# Patient Record
Sex: Male | Born: 1935 | Race: White | Hispanic: No | Marital: Married
Health system: Southern US, Community
[De-identification: ages and names within clinical notes are randomized; demographics above are authoritative.]

---

## 2004-03-26 ENCOUNTER — Other Ambulatory Visit: Payer: Self-pay

## 2005-05-09 ENCOUNTER — Ambulatory Visit: Payer: Self-pay | Admitting: Urology

## 2005-05-09 ENCOUNTER — Other Ambulatory Visit: Payer: Self-pay

## 2005-05-17 ENCOUNTER — Ambulatory Visit: Payer: Self-pay | Admitting: Urology

## 2005-10-25 ENCOUNTER — Ambulatory Visit: Payer: Self-pay | Admitting: Urology

## 2005-10-30 ENCOUNTER — Ambulatory Visit: Payer: Self-pay | Admitting: Urology

## 2005-10-30 ENCOUNTER — Other Ambulatory Visit: Payer: Self-pay

## 2005-11-06 ENCOUNTER — Ambulatory Visit: Payer: Self-pay | Admitting: Urology

## 2006-11-01 ENCOUNTER — Ambulatory Visit: Payer: Self-pay | Admitting: Urology

## 2007-05-14 ENCOUNTER — Ambulatory Visit: Payer: Self-pay | Admitting: Urology

## 2007-09-25 ENCOUNTER — Emergency Department: Payer: Self-pay | Admitting: Emergency Medicine

## 2008-01-07 ENCOUNTER — Ambulatory Visit: Payer: Self-pay | Admitting: Urology

## 2008-04-22 ENCOUNTER — Ambulatory Visit: Payer: Self-pay | Admitting: Endocrinology

## 2008-04-29 ENCOUNTER — Emergency Department: Payer: Self-pay | Admitting: Emergency Medicine

## 2008-06-08 ENCOUNTER — Other Ambulatory Visit: Payer: Self-pay

## 2008-06-08 ENCOUNTER — Ambulatory Visit: Payer: Self-pay | Admitting: Vascular Surgery

## 2008-06-14 ENCOUNTER — Inpatient Hospital Stay: Payer: Self-pay | Admitting: Vascular Surgery

## 2008-12-29 ENCOUNTER — Ambulatory Visit: Payer: Self-pay | Admitting: Urology

## 2009-11-09 ENCOUNTER — Ambulatory Visit: Payer: Self-pay | Admitting: Endocrinology

## 2010-01-31 ENCOUNTER — Emergency Department: Payer: Self-pay | Admitting: Emergency Medicine

## 2010-02-03 ENCOUNTER — Ambulatory Visit: Payer: Self-pay | Admitting: Endocrinology

## 2010-03-28 ENCOUNTER — Ambulatory Visit: Payer: Self-pay | Admitting: Psychology

## 2010-09-24 ENCOUNTER — Inpatient Hospital Stay: Payer: Self-pay | Admitting: Endocrinology

## 2011-07-14 ENCOUNTER — Emergency Department: Payer: Self-pay | Admitting: Emergency Medicine

## 2011-08-08 ENCOUNTER — Emergency Department: Payer: Self-pay | Admitting: Emergency Medicine

## 2011-08-17 ENCOUNTER — Observation Stay: Payer: Self-pay | Admitting: Student

## 2011-08-17 ENCOUNTER — Ambulatory Visit: Payer: Self-pay | Admitting: Urology

## 2011-09-18 ENCOUNTER — Ambulatory Visit: Payer: Self-pay | Admitting: Urology

## 2011-09-18 LAB — CBC WITH DIFFERENTIAL/PLATELET
Eosinophil %: 1.8 %
Lymphocyte #: 2.1 10*3/uL (ref 1.0–3.6)
Lymphocyte %: 28.6 %
MCH: 31.6 pg (ref 26.0–34.0)
MCV: 96 fL (ref 80–100)
Monocyte #: 0.7 10*3/uL (ref 0.0–0.7)
Monocyte %: 10.2 %
Neutrophil %: 58.9 %
Platelet: 225 10*3/uL (ref 150–440)
RDW: 15.1 % — ABNORMAL HIGH (ref 11.5–14.5)
WBC: 7.3 10*3/uL (ref 3.8–10.6)

## 2011-09-24 ENCOUNTER — Ambulatory Visit: Payer: Self-pay | Admitting: Cardiology

## 2011-09-25 ENCOUNTER — Ambulatory Visit: Payer: Self-pay | Admitting: Urology

## 2011-11-07 ENCOUNTER — Ambulatory Visit: Payer: Self-pay | Admitting: Unknown Physician Specialty

## 2011-12-10 ENCOUNTER — Ambulatory Visit: Payer: Self-pay | Admitting: Internal Medicine

## 2011-12-23 LAB — CBC
HCT: 34.6 % — ABNORMAL LOW (ref 40.0–52.0)
HGB: 11.4 g/dL — ABNORMAL LOW (ref 13.0–18.0)
MCH: 31.1 pg (ref 26.0–34.0)
MCHC: 33.1 g/dL (ref 32.0–36.0)
MCV: 94 fL (ref 80–100)
Platelet: 221 10*3/uL (ref 150–440)
RBC: 3.67 10*6/uL — ABNORMAL LOW (ref 4.40–5.90)

## 2011-12-23 LAB — COMPREHENSIVE METABOLIC PANEL
Anion Gap: 10 (ref 7–16)
Bilirubin,Total: 0.2 mg/dL (ref 0.2–1.0)
Calcium, Total: 8.8 mg/dL (ref 8.5–10.1)
Chloride: 109 mmol/L — ABNORMAL HIGH (ref 98–107)
Creatinine: 1.02 mg/dL (ref 0.60–1.30)
EGFR (African American): >60
EGFR (Non-African Amer.): >60
Potassium: 4.1 mmol/L (ref 3.5–5.1)
SGOT(AST): 19 U/L (ref 15–37)

## 2011-12-23 LAB — URINALYSIS, COMPLETE
Bilirubin,UR: NEGATIVE
Glucose,UR: NEGATIVE mg/dL (ref 0–75)
Ketone: NEGATIVE
Ph: 5 (ref 4.5–8.0)
Specific Gravity: 1.017 (ref 1.003–1.030)
Squamous Epithelial: NONE SEEN
WBC UR: 2 /HPF (ref 0–5)

## 2011-12-23 LAB — CK TOTAL AND CKMB (NOT AT ARMC): CK, Total: 37 U/L (ref 35–232)

## 2011-12-23 LAB — APTT: Activated PTT: 28.9 secs (ref 23.6–35.9)

## 2011-12-23 LAB — PROTIME-INR: Prothrombin Time: 14 secs (ref 11.5–14.7)

## 2011-12-24 ENCOUNTER — Inpatient Hospital Stay: Payer: Self-pay | Admitting: Internal Medicine

## 2011-12-24 LAB — TSH: Thyroid Stimulating Horm: 0.165 u[IU]/mL — ABNORMAL LOW

## 2011-12-24 LAB — T4, FREE: Free Thyroxine: 1.31 ng/dL (ref 0.76–1.46)

## 2011-12-30 LAB — CULTURE, BLOOD (SINGLE)

## 2012-01-09 ENCOUNTER — Ambulatory Visit: Payer: Self-pay | Admitting: Internal Medicine

## 2012-01-09 DEATH — deceased

## 2012-05-14 IMAGING — CT CT HEAD WITHOUT CONTRAST
2 of 3 series · 15 of 40 positions shown, 18 images · non-contrast
Comparison: none

REASON FOR EXAM: unresponsive
COMMENTS:

PROCEDURE:     CT  - CT HEAD WITHOUT CONTRAST  - December 23, 2011  [DATE]
RESULT:     Comparison is made to a prior study dated 07/14/2011.
TECHNIQUE: Helical noncontrasted 5 mm sections were obtained from the skull
base to the vertex.

[Series 2: without · axial · non-contrast · 0.41mm/px · z∈[+266,+416]mm · 12 of 36 slices shown, 15 images]
[im 3/36  brain]
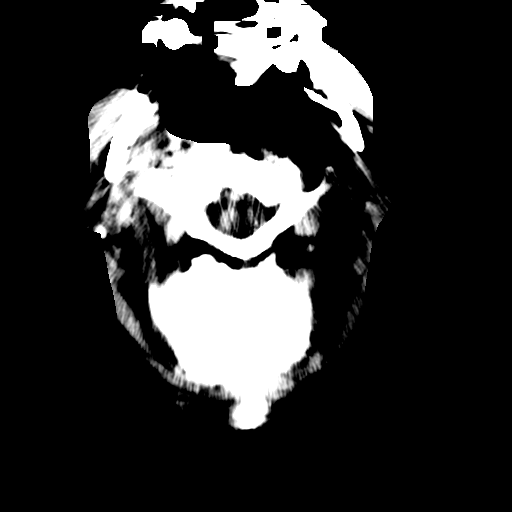
[im 3/36  bone]
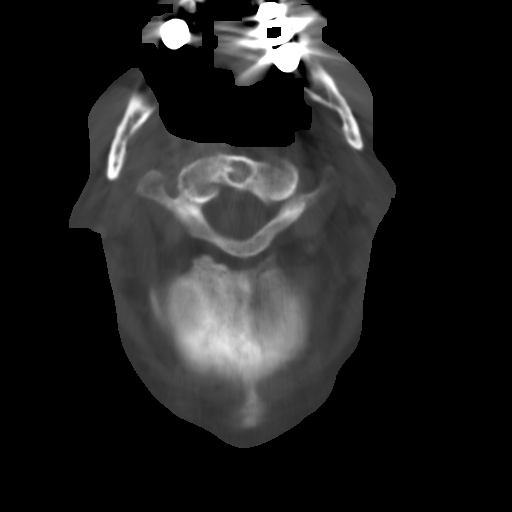
[im 6/36  brain]
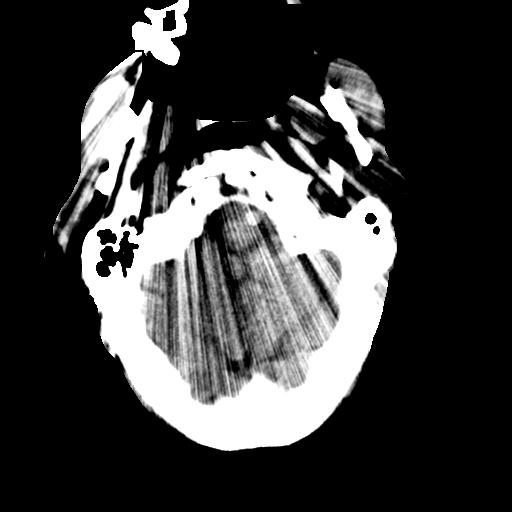
[im 8/36  brain]
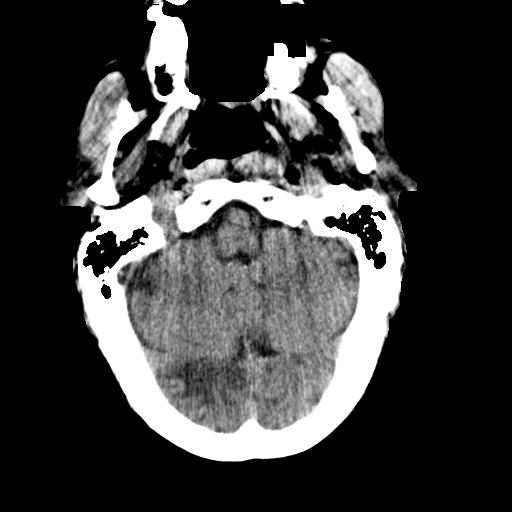
[im 11/36  brain]
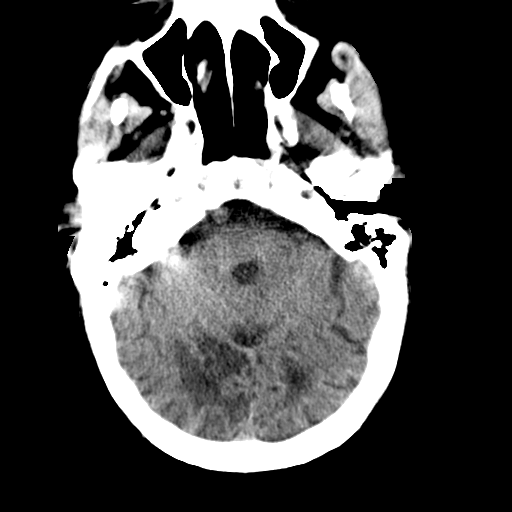
[im 13/36  brain]
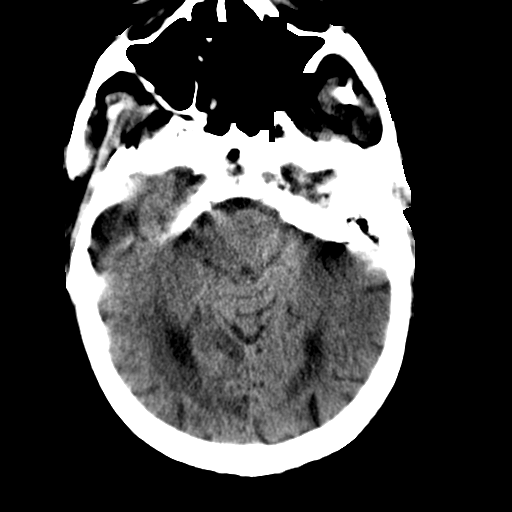
[im 13/36  bone]
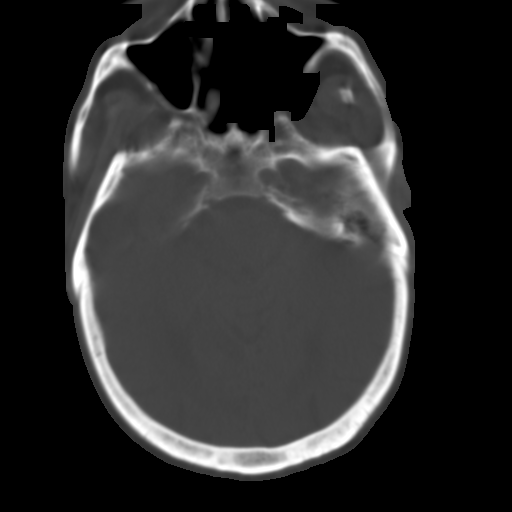
[im 16/36  brain]
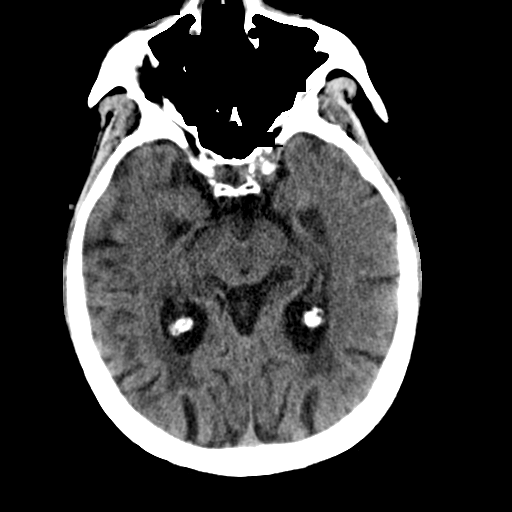
[im 21/36  brain]
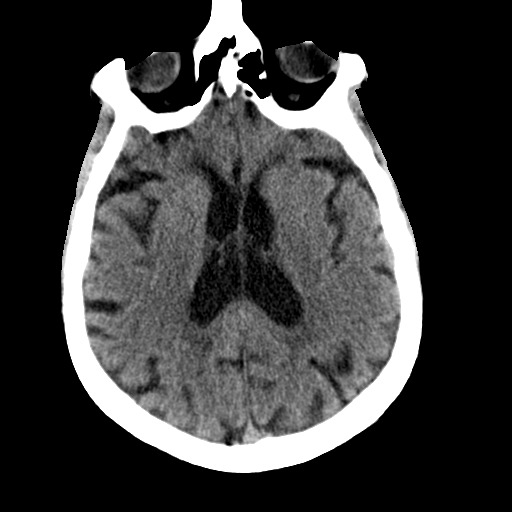
[im 23/36  brain]
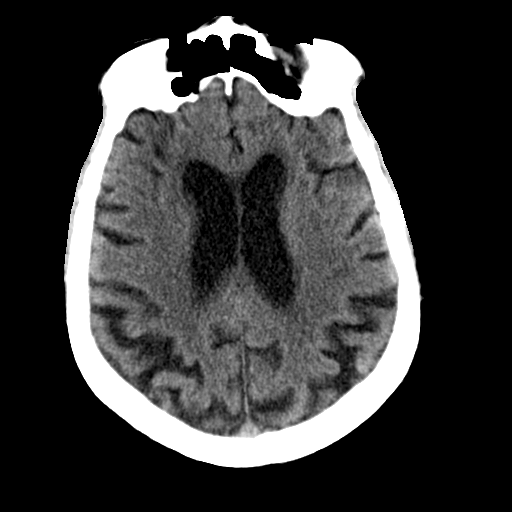
[im 26/36  brain]
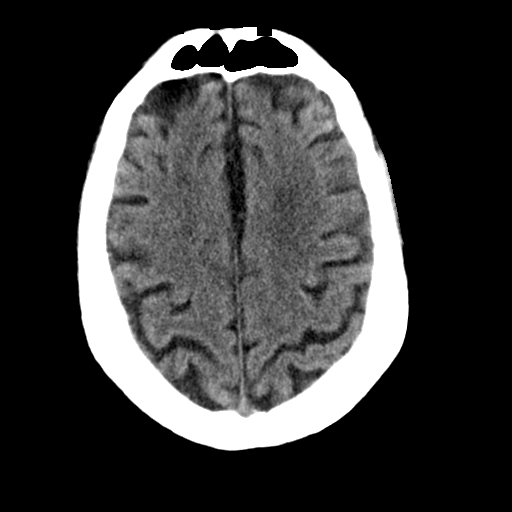
[im 26/36  bone]
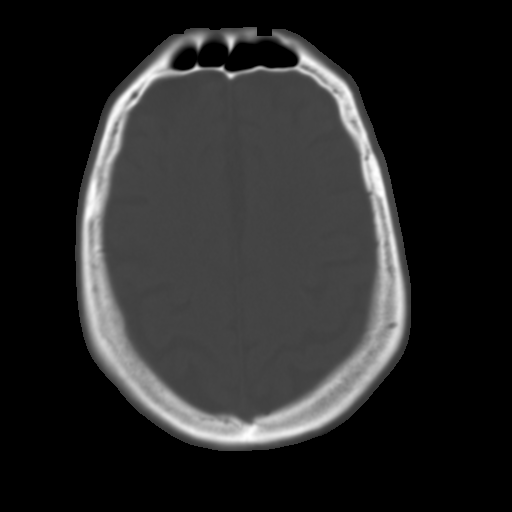
[im 28/36  brain]
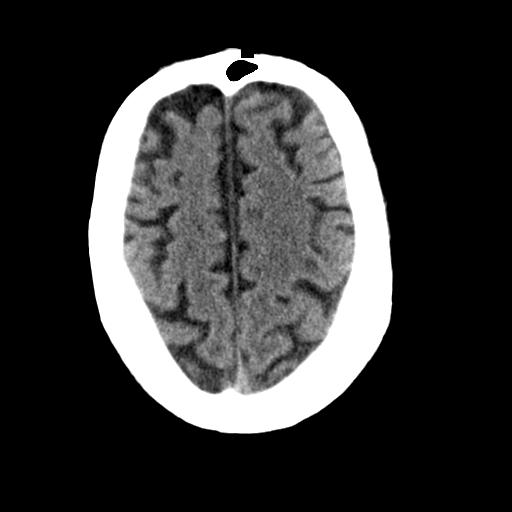
[im 31/36  brain]
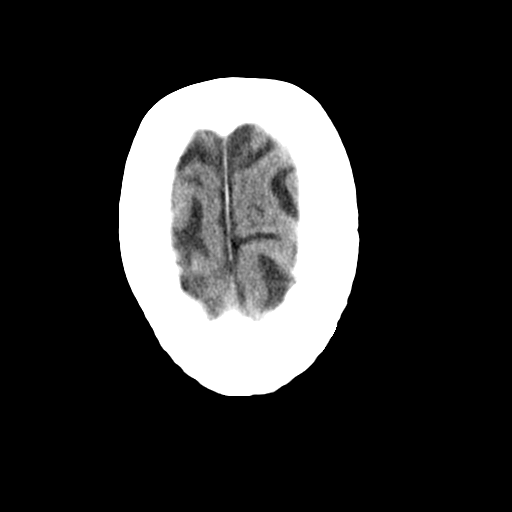
[im 33/36  brain]
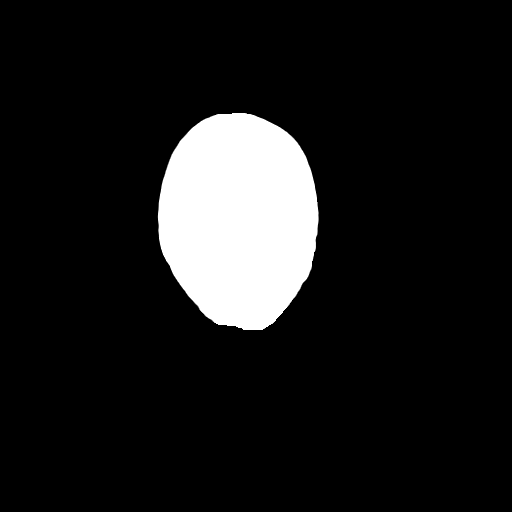

[Series 5: bone 2 · coronal · 0.41mm/px · 3 of 49 slices shown]
[im 20/49  brain]
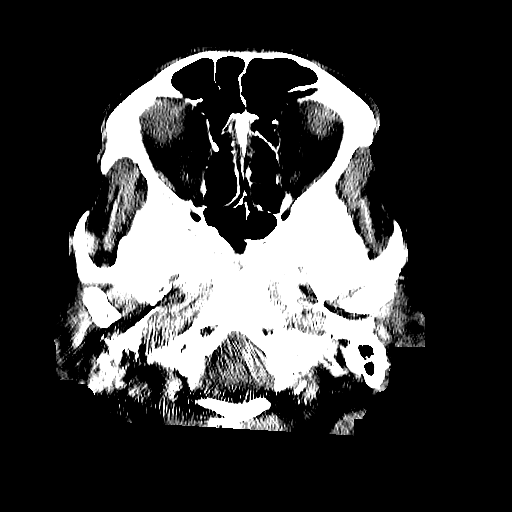
[im 23/49  brain]
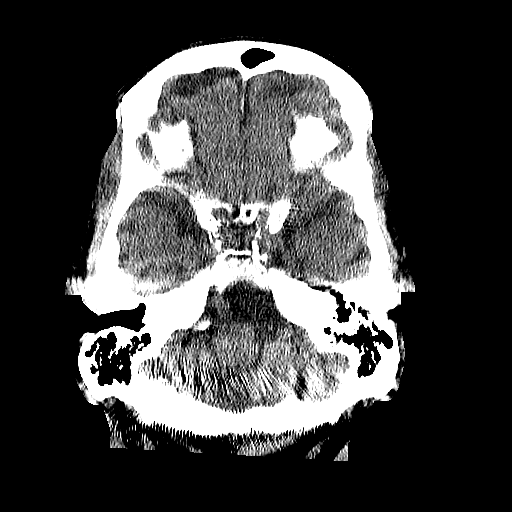
[im 26/49  brain]
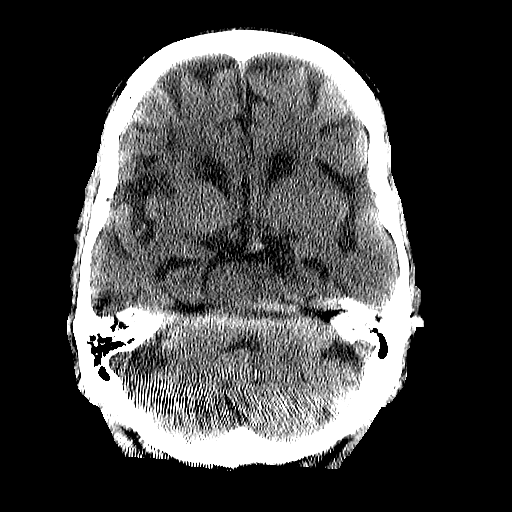

[15 of 40 positions shown; findings below may reference images not displayed]

FINDINGS: There is diffuse cortical atrophy as well as diffuse areas of
low-attenuation within the subcortical, deep, and periventricular white
matter regions. A focal area of low-attenuation projects within the
paramedial aspect of the right occipital lobe. This has an appearance of
encephalomalacic change. There is no evidence of subfalcine nor tonsillar
herniation. Mild hydrocephalus ex vacuo is identified. There is no evidence
of a depressed skull fracture. The paranasal sinuses and mastoid air cells
are patent.
IMPRESSION: 1. Involutional and chronic changes without evidence of acute abnormalities.
2. Dr. Mcnally of the Emergency Department was informed of these findings
via a preliminary faxed report.

## 2015-01-02 NOTE — Consult Note (Signed)
PATIENT NAME:  Cole Franklin, [name] MR#:  [id number] DATE OF BIRTH:  March 21, 1921  DATE OF CONSULTATION:  12/24/2011  REFERRING PHYSICIAN:   CONSULTING PHYSICIAN:  Marcina MillardAlexander Albertia Carvin, MD  PRIMARY CARE PHYSICIAN: Horton ChinShamil Morayati, MD  CARDIOLOGIST: Harold HedgeKenneth Fath, MD  CHIEF COMPLAINT: Unresponsive.   HISTORY OF PRESENT ILLNESS: The patient is an 81 year old gentleman referred for evaluation of bradycardia. The patient has baseline dementia and was in his usual state of health until yesterday. His home health provider found the patient on his knees and became unresponsive. EMS was called. The patient was unresponsive, but was found to be bradycardic. [facility] where he has remained bradycardic with heart rates in the 40s with normal blood pressure. The patient has remained unresponsive to sternal rub. Head CT was performed which revealed no acute changes.   PAST MEDICAL HISTORY:  1. Coronary artery disease status post myocardial infarction. 2. Ischemic cardiomyopathy with LV of 25 to 30%.  3. Status post left carotid endarterectomy in 2009.  4. History of respiratory failure felt to be due to congestive heart failure and left lower pneumonia, January 2012.  5. Dementia.  6. Hypertension.  7. Hyperlipidemia.  8. Atrial fibrillation.  9. Chronic obstructive pulmonary disease.  10. Gastroesophageal reflux disease. 11. History of nephrolithiasis.   MEDICATIONS:  1. Lisinopril 5 mg daily.  2. Potassium 40 mEq daily.  3. Lasix 10 mg daily.  4. Carvedilol 6.25 mg twice a day. 5. Imdur 30 mg daily.  6. Pravastatin 40 mg daily.  7. Aspirin 81 mg daily.  8. Namenda 10 mg twice a day. 9. Carafate twice a day with meals.  10. Synthroid 112 mcg daily.  11. Prilosec 20 mg every a.m.  12. Symbicort 1 puff twice a day. 13. Atrovent nasal spray 2 puffs twice a day. 14. Citracal 2 tabs daily. 15. Aricept 10 mg daily.  16. Clonazepam 0.5 mg daily.  17. Mirtazapine 7.5 mg  daily. 18. Vitamin D 1000 units daily.  19. Multi-Vite 1 daily.  20. Protonix 40 mg daily.   SOCIAL HISTORY: The patient lives at home with his wife with a 24/7 caregiver.   FAMILY HISTORY: No immediate family history of coronary artery disease or myocardial infarction.   REVIEW OF SYSTEMS: Given by the patient's daughter. CONSTITUTIONAL: No fever or chills. EYES: No blurry vision. EARS: No hearing loss. RESPIRATORY: No shortness of breath. CARDIOVASCULAR: No chest pain. GASTROINTESTINAL: No nausea, vomiting, or diarrhea. GU: No dysuria or hematuria. ENDOCRINE: No polyuria or polydipsia. NEUROLOGICAL: No focal muscle weakness or numbness. PSYCHOLOGICAL: No depression or anxiety. The patient does have baseline dementia.   PHYSICAL EXAMINATION:   VITAL SIGNS: Blood pressure 127/66, pulse 42, respirations 23, temperature 97.8, and pulse oximetry 99%.   HEENT: The patient is unresponsive.   NECK: Supple without thyromegaly.   LUNGS: Clear.   HEART: Normal jugular venous pressure. Normal point of maximal impulse. Bradycardia. Normal S1 and S2. No appreciable gallop, murmur, or rub.   ABDOMEN: Soft and nontender. Pulses were intact bilaterally. There was no cyanosis, clubbing, or edema.   MUSCULOSKELETAL: Normal muscle tone.   NEUROLOGIC: The patient is unresponsive to sternal rub.   IMPRESSION: An 81 year old gentleman who presents with decreased mental status and essentially unresponsive to deep sternal rub without evidence of hemorrhagic stroke on head CT. The patient has remained bradycardic, but hemodynamically stable. I would suspect the bradycardia is secondary to significant neurological event. At this point, I see no benefit for  temporary or permanent pacemaker.   RECOMMENDATIONS:  1. Continue present medications.  2. Hold carvedilol.  3. Defer temporary or permanent pacemaker implantation at this time.  ____________________________ Marcina Millard,  MD ap:slb D: 12/24/2011 16:36:00 ET T: 12/24/2011 17:49:12 ET JOB#: [id number]  cc: Marcina Millard, MD, <Dictator> Marcina Millard MD ELECTRONICALLY SIGNED 12/26/2011 8:47

## 2015-01-02 NOTE — Op Note (Signed)
PATIENT NAME:  Cole Franklin, Cole Franklin MR#:  [id number] DATE OF BIRTH:  06/14/1921  DATE OF PROCEDURE:  09/25/2011  PREOPERATIVE DIAGNOSIS: Bladder calculus.   POSTOPERATIVE DIAGNOSIS: Bladder calculus.   PROCEDURE: Cystoscopy, laser litholapaxy.   SURGEON: Madolyn FriezeBrian S. Achilles Dunkope, MD   ANESTHESIA: Laryngeal mask airway anesthesia.   INDICATIONS: The patient is an 81 year old gentleman with a long history of nephrolithiasis and bladder outlet obstructive symptoms. He has had recent worsening. Recent cystoscopy demonstrated an approximate 1.5 cm jack-type stone within the urinary bladder. He presents for laser litholapaxy.   DESCRIPTION OF PROCEDURE: After informed consent was obtained, the patient was taken to the operating room and placed in the dorsal lithotomy position under laryngeal mask airway anesthesia. The patient was then prepped and draped in the usual standard fashion. The 22 French rigid cystoscope was introduced into the urethra under direct vision with no urethral abnormalities noted. Upon entering the prostatic fossa, moderate bilobar hypertrophy was visualized with a moderate median lobe formation. Prominent hypervascularity was noted on the median lobe tissue. The scope was advanced into the urinary bladder. No gross mucosal abnormalities were appreciated except for a small area of erythema on the bladder base likely related to the bladder calculus. At the time of visualization, it was at the level of the right ureteral orifice. The holmium laser fiber was then utilized to fragment the stone into multiple smaller pieces. These were then irrigated free from the bladder. Revisualization of the bladder demonstrated no residual stone. The bladder was drained. The cystoscope was removed. The patient was returned to the supine position and awakened from laryngeal mask airway anesthesia. He was taken to the recovery room in stable condition. There were no problems or complications. The patient tolerated the  procedure well.  ____________________________ Madolyn FriezeBrian S. Achilles Dunkope, MD bsc:drc D: 09/25/2011 10:41:21 ET T: 09/25/2011 12:36:58 ET JOB#: [id number] Tejasvi Brissett S Amery Vandenbos MD ELECTRONICALLY SIGNED 09/25/2011 14:28

## 2015-01-02 NOTE — Consult Note (Signed)
Brief Consult Note: Diagnosis: Sinus bradycardia, ? scondary to neurological event, hemodynamically stable.   Patient was seen by consultant.   Consult note dictated.   Comments: REC  Agree wtih current therapy, hold carvedilol, defer temp or permanent pacemaker.  Electronic Signatures: Marcina MillardParaschos, Aubery Date (MD)  (Signed 15-Apr-13 16:37)  Authored: Brief Consult Note   Last Updated: 15-Apr-13 16:37 by Marcina MillardParaschos, Kazuma Elena (MD)

## 2015-01-02 NOTE — H&P (Signed)
PATIENT NAME:  Cole Franklin, Cole Franklin MR#:  [id number] DATE OF BIRTH:  Oct 03, 1920  DATE OF ADMISSION:  12/24/2011  REFERRING PHYSICIAN: Dr. Carollee Franklin    PRIMARY CARE PHYSICIAN: Cole Franklin    PRIMARY CARDIOLOGIST: Cole Franklin   PRESENTING COMPLAINT: Unresponsive.   HISTORY OF PRESENT ILLNESS: Mr. Cole Franklin is an 81 year old gentleman with history of coronary artery disease, chronic obstructive pulmonary disease, dementia, peripheral vascular disease, hyperlipidemia, atrial fibrillation, and cardiomyopathy who presents with reports of altered mental status. The patient is not able to provide history. His daughter is in the room. She was called by the Cole Franklin. According to ED records, the last person to see the patient alert was his Cole Franklin and apparently he was found by the Cole Franklin on his knees on the floor and then became unresponsive. When EMS arrived, he was sating at 98% on room air but was unresponsive and was found to be bradycardic to the 30's. When he when he arrived in the ED, his heart rate maxed out at 71 but has maintained in the low 40's. The patient remains unresponsive even to deep sternal rub.   PAST MEDICAL HISTORY:  1. Admitted December 2012 for chest pain evaluation.  2. Admitted January 2012 for acute on chronic CHF and pulmonary edema requiring intubation as well as treatment for left lower lobe pneumonia, paroxysmal atrial fibrillation.  3. Chronic obstructive pulmonary disease.  4. Restless leg syndrome.  5. Dementia.  6. Gastroesophageal reflux disease.  7. Hypertension.  8. Anxiety disorder and panic attacks.  9. Coronary artery disease status post MI.  10. Hyperlipidemia.  11. Peripheral vascular disease.  12. Nephrolithiasis.  13. Atrial fibrillation.  14. Cardiomyopathy with EF of 25 to 30% via echo in January 2012, also revealing for mild to moderate MR. 15. Carotid artery stenosis status post left CEA in 2009.  16. Chronic rhinitis.   PAST SURGICAL HISTORY:  1. Shoulder surgery.   2. Cystoscopy and laser and litholapaxy.  3. Hemorrhoidectomy.   ALLERGIES: No known drug allergies.   MEDICATIONS:  1. Namenda 10 mg b.i.d.  2. Lisinopril 5 mg daily.  3. Potassium 40 mEq daily.  4. Lasix 10 mg daily.  5. Carvedilol 6.25 mg b.i.d.  6. Imdur 30 mg daily.  7. Carafate 2 times daily with meals.  8. Synthroid 112 mcg daily.  9. Prilosec 20 mg q.a.m.  10. Symbicort 1 puff b.i.d.  11. Atrovent nasal spray two sprays per nostril b.i.d.  12. Citracal 2 tablets daily.  13. Aricept 10 mg daily.  14. Clonazepam 0.5 mg daily.  15. Mirtazapine 7.5 mg daily. 16. Pravastatin 40 mg daily.  17. Aspirin 81 mg daily.  18. Vitamin D 1000 units daily.  19. Multivitamin with minerals daily.  20. Protonix 40 mg daily.   FAMILY HISTORY: Family history of hypertension. Daughter does not know of any other medical history in the family.   SOCIAL HISTORY: [place] He gets 24/7 caregiver at home. He quit tobacco 30 years ago. No alcohol or drug use.   REVIEW OF SYSTEMS: Unable to obtain due to the patient's unresponsive state. According to daughter, he was in his usual state of health up until his presentation.   PHYSICAL EXAMINATION:   VITAL SIGNS: Temperature 99.7, initial heart rate 71, current heart rate in the low 40's, respiratory rate 25, blood pressure 158/69, sating at 98% on 2 liters.   GENERAL: Lying in bed unresponsive in no apparent distress.   HEENT: Normocephalic, atraumatic. Pupils slightly  dilated but symmetric. Nasal cannula in place. He has dry mucous membranes.   NECK: Soft and supple. No adenopathy.   CARDIOVASCULAR: Bradycardic. No murmurs, rubs, or gallops.   LUNGS: Clear to auscultation bilaterally. No use of accessory muscles or increased respiratory effort.   ABDOMEN: Soft. Positive bowel sounds. No mass appreciated.   EXTREMITIES: Trace edema bilaterally. Dorsal pedis pulses intact. He has intermittent jerking movements of his lower  extremity with his history of restless leg syndrome.   MUSCULOSKELETAL: No joint effusion.   SKIN: No apparent ulcers.   NEUROLOGIC: Unable to cooperative with neurological exam. The patient is unresponsive.   PERTINENT LABS AND STUDIES: ABG with pH of 7.42, pCO2 44, pO2 124, bicarb 28.5. Urinalysis with specific gravity of 1.017, blood 1+, pH 5, RBC 12 per high-power field, WBC 2 per high-power field, trace bacteria. Troponin 0.02. CK 37. MB 1.3. INR 1.0. PTT 28.9. WBC 8.6, hemoglobin 11.4, hematocrit 34.6, platelets 221, MCV 94, glucose 92, BUN 18, creatinine 0.102, sodium 146, potassium 4.1, chloride 109, carbon dioxide 27, calcium 8.8, total bilirubin 0.2, alkaline phosphatase 80, ALT 14, AST 19, total protein 6.3, albumin 3.3.   EKG with sinus brady of 47. No ST elevation or depression. There is T wave inversion in V1 and aVR.   CT of the head with diffuse atrophy. There is focal low attenuation and encephalomalacia involving the right occipital lobe consistent with remote infarct. Otherwise, no acute findings.   ASSESSMENT AND PLAN: Mr. Cole Franklin is an 81 year old gentleman with history of chronic obstructive pulmonary disease, dementia, coronary artery disease, cardiomyopathy, restless leg syndrome, atrial fibrillation, and peripheral vascular disease presenting with altered mental status and unresponsiveness.  1. Altered mental status, unresponsiveness. CT head is unrevealing. There seems to be no source of infection. He remains bradycardic between 30's and 40's, likely cardiac versus acute CVA. His first set of enzymes are negative. Will continue to cycle cardiac enzymes. Send TSH, folate, B12, ammonia. The patient is high risk for cardiopulmonary arrest given his current situation. Discussed with his daughter at length as far as CODE STATUS and his outcome. She would like to touch base with her sister with regards to further management. For now will hold off on additional studies including echo  and MRI. Will obtain Palliative Care consultation. Keep patient n.p.o. Start aspirin suppository.  2. Dehydration. Gentle fluid repletion for now. Clinical exam is with some dehydration and he has hypernatremia.  3. Coronary artery disease. As above, hold off on additional studies for now and awaiting family's decision. At this point may wish to convert to comfort care.  4. Atrial fibrillation, currently sinus brady. Again, holding all medications especially carvedilol in light of his bradycardia.  5. Prophylaxis with Lovenox and Protonix.   TIME SPENT: Approximately 50 minutes spent on patient care.   ____________________________ Reuel Derby, MD ap:drc D: 12/24/2011 00:56:00 ET T: 12/24/2011 06:58:39 ET JOB#: [id number]  cc: Pearlean Brownie Nahiem Dredge, MD, <Dictator>, Alan Mulder, MD Reuel Derby MD ELECTRONICALLY SIGNED 01/06/2012 2:04

## 2015-01-02 NOTE — Discharge Summary (Signed)
PATIENT NAME:  Cole Franklin, Cole Franklin MR#:  [id number] DATE OF BIRTH:  [birth year]  DATE OF ADMISSION:  12/24/2011 DATE OF DISCHARGE:  12/26/2011  ADMITTING PHYSICIAN: Alounthith Phichith, MD.  DISCHARGING PHYSICIAN: Enid Baasadhika Jazleen Robeck, MD.   PRIMARY CARE PHYSICIAN: Dr. Patrecia PaceMorayati.  PRIMARY CARDIOLOGIST: Dr. Lady GaryFath.   CONSULTATIONS:  1. Ned GraceNancy Phifer, MD, palliative care. 2. Cardiology consultation by Dr. Darrold JunkerParaschos.   DISCHARGE DIAGNOSES:  1. Unresponsiveness, likely underlying cerebrovascular accident, neurologic event. No further work-up done as family wished comfort care.  2. History of atrial fibrillation with sinus bradycardia on admission.  3. Dementia.  4. Congestive heart failure with systolic dysfunction, ejection fraction of 25 to 30%.  5. Coronary artery disease.  6. Chronic obstructive pulmonary disease.  7. Carotid artery stenosis status post carotid endarterectomy. 8. Nephrolithiasis.  9. Peripheral vascular disease.  10. History of anxiety and panic attacks.  11. Restless leg syndrome.   DISCHARGE MEDICATIONS:  1. Roxanol 20 mg per mL 0.25 to 0.5 mL p.o. every one to two hours p.r.n.  2. Ativan 0.5 to 1 mg p.o./sublingual q.2 to 4 hours p.r.n.  3. Ranitidine 150 mg p.o. b.i.d.  4. ABHR suppository one per rectum every 4 to 6 hours p.r.n.   DISCHARGE DIET: As tolerated by the patient if he gets more awake.   DISCHARGE ACTIVITY: As tolerated.   FOLLOWUP INSTRUCTIONS: The patient is being transferred to hospice home.   LABS AND IMAGING STUDIES: Blood cultures remained negative. TSH low at 0.165, but FT4 normal at 1.31. CT of the head without contrast showing involutional chronic changes without evidence of acute abnormalities. This was done on admission. ABG showing pH of 7.42, pCO2 44, pO2 124, bicarbonate 28.4 and sats 98% on 2 liters. Urinalysis negative for any infection. Chest x-ray showing shallow inspiration. No evidence of acute cardiopulmonary disease. Cardiac enzymes  remained negative. WBC 8.6, hemoglobin 11.1, hematocrit 33.6, platelet count 21. Sodium 146, potassium 4.2, chloride 109, bicarbonate 27, BUN 18, creatinine 1.02, glucose 92, calcium 8.8. ALT 14, AST 19, alkaline phosphatase 80, total bilirubin 0.2, albumin 3.3.   BRIEF HOSPITAL COURSE: Cole Franklin is an 81 year old elderly gentleman with history of dementia, coronary artery disease, congestive heart failure, atrial fibrillation who lives at home with his wife who also has dementia, being cared by a 24 hour CNA who was brought in secondary to a sudden unresponsive episode.    Unresponsive episode, initially thought to be either a neurologic or cardiac event as he was also found to be bradycardic. He was evaluated by Dr. Darrold JunkerParaschos. His bradycardia improved, but his mental status has not recovered. It was thought that he probably had underlying large cerebrovascular accident or some kind of neurological event that could have caused his bradycardia, but his heart rate improved to 50s. No need for pacemaker as he was maintaining his blood pressure. Family refused further MRI or further work-up. Palliative care has been consulted and because his mental status has not improved over greater than 48 hours, then consider transferring to hospice home. He is not able to eat or take any medications because of his mental status, so mostly Roxanol or Ativan will be administered for comfort.   CODE STATUS: DO NOT RESUSCITATE.   DISCHARGE CONDITION: Guarded with poor prognosis.   DISCHARGE DISPOSITION: Hospice home.       TIME SPENT ON DISCHARGE: 40 minutes.    ____________________________ Enid Baasadhika Heaton Sarin, MD rk:ap D: 12/26/2011 11:21:35 ET T: 12/27/2011 11:17:22 ET JOB#: [id number]  cc: Enid Baasadhika Zella Dewan, MD, <  Dictator> Alan Mulder, MD Enid Baas MD ELECTRONICALLY SIGNED 12/28/2011 14:37
